# Patient Record
Sex: Male | Born: 1985 | Race: Black or African American | Hispanic: No | Marital: Single | Smoking: Former smoker
Health system: Southern US, Community
[De-identification: ages and names within clinical notes are randomized; demographics above are authoritative.]

## PROBLEM LIST (undated history)

## (undated) DIAGNOSIS — N200 Calculus of kidney: Secondary | ICD-10-CM

## (undated) DIAGNOSIS — S42009A Fracture of unspecified part of unspecified clavicle, initial encounter for closed fracture: Secondary | ICD-10-CM

## (undated) HISTORY — DX: Fracture of unspecified part of unspecified clavicle, initial encounter for closed fracture: S42.009A

## (undated) HISTORY — DX: Calculus of kidney: N20.0

---

## 2019-10-15 ENCOUNTER — Other Ambulatory Visit: Payer: Self-pay

## 2019-10-15 ENCOUNTER — Emergency Department (HOSPITAL_COMMUNITY)
Admission: EM | Admit: 2019-10-15 | Discharge: 2019-10-15 | Disposition: A | Discharge status: Home / Self Care | Payer: Self-pay | Attending: Emergency Medicine | Admitting: Emergency Medicine

## 2019-10-15 ENCOUNTER — Encounter (HOSPITAL_COMMUNITY): Payer: Self-pay

## 2019-10-15 DIAGNOSIS — K029 Dental caries, unspecified: Secondary | ICD-10-CM

## 2019-10-15 DIAGNOSIS — F17200 Nicotine dependence, unspecified, uncomplicated: Secondary | ICD-10-CM | POA: Insufficient documentation

## 2019-10-15 DIAGNOSIS — K047 Periapical abscess without sinus: Secondary | ICD-10-CM

## 2019-10-15 MED ORDER — HYDROCODONE-ACETAMINOPHEN 5-325 MG PO TABS: 1.0000 | ORAL_TABLET | Freq: Four times a day (QID) | ORAL | 0 refills | Status: DC | PRN

## 2019-10-15 MED ORDER — AMOXICILLIN 500 MG PO CAPS: 500.0000 mg | ORAL_CAPSULE | Freq: Three times a day (TID) | ORAL | 0 refills | Status: AC

## 2019-10-15 NOTE — ED Provider Notes (Signed)
[facility] EMERGENCY DEPARTMENT Provider Note   CSN: [id number] Arrival date & time: 10/15/19  1104     History Chief Complaint  Patient presents with  . Dental Pain    Dietrick Barris is a 34 y.o. male presenting with a 2 week history of escalating low right molar dental pain radiating into his right jaw.  Reports a fracture of his right    The patient has a history of injury and/or decay in the tooth involved which has recently started to cause increased  pain.  There has been no fevers, chills, nausea or vomiting, also no complaint of difficulty swallowing, although chewing makes pain worse.  The patient has tried ambesol and has been taking tylenol 2 tablets and motrin 2 tablets qam before starting work without relief of symptoms.  He endorses cold sensitivity and difficulty chewing on the right side.    The history is provided by the patient.       History reviewed. No pertinent past medical history.  There are no problems to display for this patient.   History reviewed. No pertinent surgical history.     No family history on file.  Social History   Tobacco Use  . Smoking status: Current Some Day Smoker  . Smokeless tobacco: Never Used  Substance Use Topics  . Alcohol use: Yes    Comment: occ  . Drug use: Never    Home Medications Prior to Admission medications   Medication Sig Start Date End Date Taking? Authorizing Provider  amoxicillin (AMOXIL) 500 MG capsule Take 1 capsule (500 mg total) by mouth 3 (three) times daily for 10 days. 10/15/19 10/25/19  Burgess Amor, PA-C  HYDROcodone-acetaminophen (NORCO/VICODIN) 5-325 MG tablet Take 1 tablet by mouth every 6 (six) hours as needed for moderate pain. 10/15/19   Burgess Amor, PA-C    Allergies    Patient has no known allergies.  Review of Systems   Review of Systems  Constitutional: Negative for fever.  HENT: Positive for dental problem. Negative for facial swelling and sore throat.   Respiratory: Negative for  shortness of breath.   Musculoskeletal: Negative for neck pain and neck stiffness.  All other systems reviewed and are negative.   Physical Exam Updated Vital Signs BP 115/71 (BP Location: Right Arm)   Pulse 82   Temp 97.8 F (36.6 C) (Oral)   Resp 18   Ht 5\' 6"  (1.676 m)   Wt 79.4 kg   SpO2 95%   BMI 28.25 kg/m   Physical Exam Constitutional:      General: He is not in acute distress.    Appearance: He is well-developed.  HENT:     Head: Normocephalic and atraumatic.     Jaw: No trismus, tenderness, swelling or pain on movement.     Right Ear: Tympanic membrane and external ear normal.     Left Ear: Tympanic membrane and external ear normal.     Mouth/Throat:     Mouth: No oral lesions.     Dentition: Dental tenderness and dental caries present.     Pharynx: Oropharynx is clear. Uvula midline.   Eyes:     Conjunctiva/sclera: Conjunctivae normal.  Cardiovascular:     Rate and Rhythm: Normal rate.     Heart sounds: Normal heart sounds.  Pulmonary:     Effort: Pulmonary effort is normal.  Musculoskeletal:        General: Normal range of motion.     Cervical back: Normal range of motion and  neck supple.  Lymphadenopathy:     Cervical: No cervical adenopathy.  Skin:    General: Skin is warm and dry.     Findings: No erythema.  Neurological:     Mental Status: He is alert and oriented to person, place, and time.     ED Results / Procedures / Treatments   Labs (all labs ordered are listed, but only abnormal results are displayed) Labs Reviewed - No data to display  EKG None  Radiology No results found.  Procedures Procedures (including critical care time)  Medications Ordered in ED Medications - No data to display  ED Course  I have reviewed the triage vital signs and the nursing notes.  Pertinent labs & imaging results that were available during my care of the patient were reviewed by me and considered in my medical decision making (see chart for  details).    MDM Rules/Calculators/A&P                          Pt with dental pain and cold sensitivity, suspected early apical infection.  Started on amoxil, few hydrocodone prescribed.    Prior to dc, call from pharmacy at [facility] stated they are out of norco.  Verbal order to switch from hydrocodone to tramadol 50 mg #10, q 6 hours prn pain. Advised pt of prescription change.  Pt was given local dentist list as he is looking for a local dentist since recently moving here.   narcotic database reviewed.  Final Clinical Impression(s) / ED Diagnoses Final diagnoses:  Infected dental caries    Rx / DC Orders ED Discharge Orders         Ordered    HYDROcodone-acetaminophen (NORCO/VICODIN) 5-325 MG tablet  Every 6 hours PRN     Discontinue  Reprint     10/15/19 1148    amoxicillin (AMOXIL) 500 MG capsule  3 times daily     Discontinue  Reprint     10/15/19 1148           Victoriano Lain 10/15/19 1204    Eber Hong, MD 10/16/19 0700

## 2019-10-15 NOTE — Discharge Instructions (Addendum)
Complete your entire course of antibiotics as prescribed.  You  may use the hydrocodone for pain relief but do not drive within 4 hours of taking as this will make you drowsy.  Avoid applying heat or ice to this abscess area which can worsen your symptoms.  You may use warm salt water swish and spit treatment or half peroxide and water swish and spit after meals to keep this area clean as discussed.   After the first day of antibiotics, motrin should be adequate for pain relief.  See the dentist list provided.

## 2019-10-15 NOTE — ED Notes (Signed)
Pt sitting up in bed, pt has broken tooth R lower jaw, pt states that he has had pain for the past week, has no dentist, states that he was treating with motrin and tylenol, states that it was helping, but isn't helping any more.  resps even and unlabored,

## 2019-10-15 NOTE — ED Triage Notes (Signed)
Pt reports r jaw pain x 2 weeks.

## 2019-11-13 ENCOUNTER — Encounter (HOSPITAL_COMMUNITY): Payer: Self-pay | Admitting: *Deleted

## 2019-11-13 ENCOUNTER — Emergency Department (HOSPITAL_COMMUNITY)
Admission: EM | Admit: 2019-11-13 | Discharge: 2019-11-13 | Disposition: A | Discharge status: Home / Self Care | Payer: Self-pay | Attending: Emergency Medicine | Admitting: Emergency Medicine

## 2019-11-13 ENCOUNTER — Other Ambulatory Visit: Payer: Self-pay

## 2019-11-13 DIAGNOSIS — F17200 Nicotine dependence, unspecified, uncomplicated: Secondary | ICD-10-CM | POA: Insufficient documentation

## 2019-11-13 DIAGNOSIS — R202 Paresthesia of skin: Secondary | ICD-10-CM | POA: Insufficient documentation

## 2019-11-13 DIAGNOSIS — M5442 Lumbago with sciatica, left side: Secondary | ICD-10-CM | POA: Insufficient documentation

## 2019-11-13 MED ORDER — PREDNISONE 10 MG PO TABS
ORAL_TABLET | ORAL | 0 refills | Status: DC
Start: 2019-11-13 — End: 2020-03-25

## 2019-11-13 MED ORDER — KETOROLAC TROMETHAMINE 60 MG/2ML IM SOLN
60.0000 mg | Freq: Once | INTRAMUSCULAR | Status: AC
  Administered 2019-11-13: 60 mg via INTRAMUSCULAR
  Filled 2019-11-13: qty 2

## 2019-11-13 MED ORDER — HYDROCODONE-ACETAMINOPHEN 5-325 MG PO TABS: ORAL_TABLET | ORAL | 0 refills | Status: DC

## 2019-11-13 MED ORDER — METHOCARBAMOL 500 MG PO TABS
500.0000 mg | ORAL_TABLET | Freq: Once | ORAL | Status: AC
  Administered 2019-11-13: 500 mg via ORAL
  Filled 2019-11-13: qty 1

## 2019-11-13 MED ORDER — METHOCARBAMOL 500 MG PO TABS
500.0000 mg | ORAL_TABLET | Freq: Three times a day (TID) | ORAL | 0 refills | Status: DC
Start: 2019-11-13 — End: 2020-03-25

## 2019-11-13 NOTE — ED Triage Notes (Signed)
Pt with lower back pain that radiates down left leg since Tuesday.  Pt does a lot of heavy lifting.

## 2019-11-13 NOTE — Discharge Instructions (Signed)
Continue to alternate ice and heat to your lower back.  Avoid bending over, heavy lifting or twisting movements for at least 1 week.  Call the provider listed to arrange a follow-up appointment in 1 week if your symptoms are not improving.

## 2019-11-13 NOTE — ED Provider Notes (Signed)
[facility] EMERGENCY DEPARTMENT Provider Note   CSN: [id number] Arrival date & time: 11/13/19  1830     History Chief Complaint  Patient presents with  . Back Pain    Frank Carter is a 34 y.o. male.  HPI      Frank Carter is a 34 y.o. male who presents to the Emergency Department complaining of left-sided low back pain of sudden onset x2 days.  He states that he was shoveling mud on Tuesday when he felt a sharp pain to his left lower back that radiates into his left buttock, hip and thigh.  Pain is worse with certain positions and improves at rest.  He reports history of recurrent back pain.  He has tried multiple over-the-counter remedies without relief.  Back pain is associated with intermittent tingling and numbness of his leg and into his left foot.  He denies fever, chills, dysuria, urine or bowel changes, abdominal pain and left groin pain.   History reviewed. No pertinent past medical history.  There are no problems to display for this patient.   History reviewed. No pertinent surgical history.    History reviewed. No pertinent family history.  Social History   Tobacco Use  . Smoking status: Current Some Day Smoker    Types: Cigarettes  . Smokeless tobacco: Never Used  Substance Use Topics  . Alcohol use: Yes    Comment: occ  . Drug use: Never    Home Medications Prior to Admission medications   Medication Sig Start Date End Date Taking? Authorizing Provider  HYDROcodone-acetaminophen (NORCO/VICODIN) 5-325 MG tablet Take 1 tablet by mouth every 6 (six) hours as needed for moderate pain. 10/15/19   Burgess Amor, PA-C    Allergies    Patient has no known allergies.  Review of Systems   Review of Systems  Constitutional: Negative for fever.  Respiratory: Negative for shortness of breath.   Cardiovascular: Negative for chest pain and leg swelling.  Gastrointestinal: Negative for abdominal pain, constipation, diarrhea, nausea and vomiting.    Genitourinary: Negative for decreased urine volume, difficulty urinating, dysuria, flank pain and hematuria.  Musculoskeletal: Positive for back pain. Negative for joint swelling.  Skin: Negative for color change and rash.  Neurological: Negative for weakness and numbness.    Physical Exam Updated Vital Signs BP 121/72 (BP Location: Right Arm)   Pulse 88   Temp 98 F (36.7 C) (Oral)   Resp 16   Ht 5\' 6"  (1.676 m)   Wt 79.4 kg   SpO2 97%   BMI 28.25 kg/m   Physical Exam Vitals and nursing note reviewed.  Constitutional:      General: He is not in acute distress.    Appearance: Normal appearance. He is well-developed. He is not toxic-appearing.  HENT:     Head: Normocephalic.  Cardiovascular:     Rate and Rhythm: Normal rate and regular rhythm.     Pulses: Normal pulses.     Comments: DP pulses are strong and palpable bilaterally Pulmonary:     Effort: Pulmonary effort is normal. No respiratory distress.     Breath sounds: Normal breath sounds.  Abdominal:     General: There is no distension.     Palpations: Abdomen is soft.     Tenderness: There is no abdominal tenderness.  Musculoskeletal:        General: Tenderness present.     Cervical back: Normal range of motion and neck supple.     Lumbar back: Tenderness present. No  swelling, deformity or lacerations. Normal range of motion.     Comments: ttp of the left lumbar paraspinal muscles and left SI joint space. No spinal tenderness.  Pt has 5/5 strength against resistance of bilateral lower extremities.     Skin:    General: Skin is warm.     Capillary Refill: Capillary refill takes less than 2 seconds.     Findings: No rash.  Neurological:     General: No focal deficit present.     Mental Status: He is alert.     Sensory: Sensation is intact. No sensory deficit.     Motor: Motor function is intact. No weakness or abnormal muscle tone.     Coordination: Coordination is intact. Coordination normal.     Gait: Gait  normal.     Deep Tendon Reflexes:     Reflex Scores:      Patellar reflexes are 2+ on the right side and 2+ on the left side.      Achilles reflexes are 2+ on the right side and 2+ on the left side.    ED Results / Procedures / Treatments   Labs (all labs ordered are listed, but only abnormal results are displayed) Labs Reviewed - No data to display  EKG None  Radiology No results found.  Procedures Procedures (including critical care time)  Medications Ordered in ED Medications  ketorolac (TORADOL) injection 60 mg (has no administration in time range)  methocarbamol (ROBAXIN) tablet 500 mg (has no administration in time range)    ED Course  I have reviewed the triage vital signs and the nursing notes.  Pertinent labs & imaging results that were available during my care of the patient were reviewed by me and considered in my medical decision making (see chart for details).    MDM Rules/Calculators/A&P                          Patient here with recurrent left-sided low back pain that began after shoveling mud.  Describes radicular symptoms that radiate into the level of the lower left thigh.  He is ambulatory and his gait is steady.  No focal neuro deficits on exam.  No concerning symptoms for cauda equina.  Likely sciatica although I have also discussed possibility of herniated disc.  Patient agrees to treatment plan with conservative therapy and close outpatient follow-up recommended if symptoms are not improving.   Final Clinical Impression(s) / ED Diagnoses Final diagnoses:  Acute left-sided low back pain with left-sided sciatica    Rx / DC Orders ED Discharge Orders    None       Rosey Bath 11/13/19 Brunilda Payor, MD 11/13/19 2334

## 2019-12-23 ENCOUNTER — Emergency Department (HOSPITAL_COMMUNITY)
Admission: EM | Admit: 2019-12-23 | Discharge: 2019-12-23 | Disposition: A | Discharge status: Home / Self Care | Payer: Self-pay | Attending: Emergency Medicine | Admitting: Emergency Medicine

## 2019-12-23 ENCOUNTER — Encounter (HOSPITAL_COMMUNITY): Payer: Self-pay

## 2019-12-23 ENCOUNTER — Other Ambulatory Visit: Payer: Self-pay

## 2019-12-23 DIAGNOSIS — Z5321 Procedure and treatment not carried out due to patient leaving prior to being seen by health care provider: Secondary | ICD-10-CM | POA: Insufficient documentation

## 2019-12-23 DIAGNOSIS — K0889 Other specified disorders of teeth and supporting structures: Secondary | ICD-10-CM | POA: Insufficient documentation

## 2019-12-23 NOTE — ED Triage Notes (Signed)
Pt c/o r sided toothache x 1 month.

## 2019-12-24 ENCOUNTER — Other Ambulatory Visit: Payer: Self-pay

## 2019-12-24 ENCOUNTER — Emergency Department (HOSPITAL_COMMUNITY)
Admission: EM | Admit: 2019-12-24 | Discharge: 2019-12-24 | Disposition: A | Discharge status: Home / Self Care | Payer: Self-pay | Attending: Emergency Medicine | Admitting: Emergency Medicine

## 2019-12-24 ENCOUNTER — Encounter (HOSPITAL_COMMUNITY): Payer: Self-pay | Admitting: Emergency Medicine

## 2019-12-24 DIAGNOSIS — K047 Periapical abscess without sinus: Secondary | ICD-10-CM | POA: Insufficient documentation

## 2019-12-24 DIAGNOSIS — F17210 Nicotine dependence, cigarettes, uncomplicated: Secondary | ICD-10-CM | POA: Insufficient documentation

## 2019-12-24 MED ORDER — CLINDAMYCIN HCL 300 MG PO CAPS
300.0000 mg | ORAL_CAPSULE | Freq: Four times a day (QID) | ORAL | 0 refills | Status: DC
Start: 2019-12-24 — End: 2020-03-25

## 2019-12-24 MED ORDER — CLINDAMYCIN HCL 150 MG PO CAPS
300.0000 mg | ORAL_CAPSULE | Freq: Once | ORAL | Status: AC
  Administered 2019-12-24: 300 mg via ORAL
  Filled 2019-12-24: qty 2

## 2019-12-24 MED ORDER — OXYCODONE-ACETAMINOPHEN 5-325 MG PO TABS
2.0000 | ORAL_TABLET | Freq: Once | ORAL | Status: AC
  Administered 2019-12-24: 2 via ORAL
  Filled 2019-12-24: qty 2

## 2019-12-24 MED ORDER — OXYCODONE-ACETAMINOPHEN 5-325 MG PO TABS: 1.0000 | ORAL_TABLET | Freq: Four times a day (QID) | ORAL | 0 refills | Status: DC | PRN

## 2019-12-24 NOTE — Discharge Instructions (Addendum)
Begin taking clindamycin as prescribed and Percocet as prescribed as needed for pain.  Follow-up with your dentist at 8 AM as previously arranged.

## 2019-12-24 NOTE — ED Provider Notes (Signed)
[facility] EMERGENCY DEPARTMENT Provider Note   CSN: [id number] Arrival date & time: 12/24/19  0214     History Chief Complaint  Patient presents with  . Dental Pain    Frank Carter is a 34 y.o. male.  Patient is a 34 year old male with no significant past medical history.  He presents with complaints of dental pain.  He describes pain to his right lower jaw and right side of his face.  He denies any difficulty breathing or swallowing.  He denies any fevers or chills.  He does have an appointment to see the dentist at 8 AM this morning, but woke up with worse pain and bleeding from his mouth.  The history is provided by the patient.  Dental Pain Location:  Lower Lower teeth location:  29/RL 2nd bicuspid Quality:  Throbbing Severity:  Severe Duration:  1 month Timing:  Constant Progression:  Worsening Context: abscess        History reviewed. No pertinent past medical history.  There are no problems to display for this patient.   History reviewed. No pertinent surgical history.     No family history on file.  Social History   Tobacco Use  . Smoking status: Current Some Day Smoker    Types: Cigarettes  . Smokeless tobacco: Never Used  Substance Use Topics  . Alcohol use: Yes    Comment: occ  . Drug use: Never    Home Medications Prior to Admission medications   Medication Sig Start Date End Date Taking? Authorizing Provider  HYDROcodone-acetaminophen (NORCO/VICODIN) 5-325 MG tablet Take one tab po q 4 hrs prn pain 11/13/19   Triplett, Tammy, PA-C  methocarbamol (ROBAXIN) 500 MG tablet Take 1 tablet (500 mg total) by mouth 3 (three) times daily. 11/13/19   Triplett, Tammy, PA-C  predniSONE (DELTASONE) 10 MG tablet Take 6 tablets day one, 5 tablets day two, 4 tablets day three, 3 tablets day four, 2 tablets day five, then 1 tablet day six 11/13/19   Triplett, Tammy, PA-C    Allergies    Patient has no known allergies.  Review of Systems   Review of  Systems  All other systems reviewed and are negative.   Physical Exam Updated Vital Signs There were no vitals taken for this visit.  Physical Exam Vitals and nursing note reviewed.  Constitutional:      General: He is not in acute distress.    Appearance: Normal appearance. He is not ill-appearing or diaphoretic.  HENT:     Head: Normocephalic and atraumatic.     Mouth/Throat:     Mouth: Mucous membranes are moist.     Pharynx: No oropharyngeal exudate.     Comments: In the area of the right lower bicuspids, there is a swollen area adjacent to the gum consistent with an abscess.  There is some bleeding and drainage from this.  There is no swelling of the submental space.  There is no trismus or stridor. Pulmonary:     Effort: Pulmonary effort is normal.  Skin:    General: Skin is warm and dry.  Neurological:     Mental Status: He is alert.     ED Results / Procedures / Treatments   Labs (all labs ordered are listed, but only abnormal results are displayed) Labs Reviewed - No data to display  EKG None  Radiology No results found.  Procedures Procedures (including critical care time)  Medications Ordered in ED Medications  oxyCODONE-acetaminophen (PERCOCET/ROXICET) 5-325 MG per tablet 2  tablet (has no administration in time range)  clindamycin (CLEOCIN) capsule 300 mg (has no administration in time range)    ED Course  I have reviewed the triage vital signs and the nursing notes.  Pertinent labs & imaging results that were available during my care of the patient were reviewed by me and considered in my medical decision making (see chart for details).    MDM Rules/Calculators/A&P  Patient with a dental abscess that appears to have ruptured in the night.  He does have an appointment with the dentist in a few hours.  Patient will be started on clindamycin and given pain medication.  He is to keep his appointment, but otherwise appears stable for discharge.  Final  Clinical Impression(s) / ED Diagnoses Final diagnoses:  None    Rx / DC Orders ED Discharge Orders    None       Geoffery Lyons, MD 12/24/19 [phone]

## 2019-12-24 NOTE — ED Triage Notes (Signed)
Pt C/O right sided lower dental pain x 1 month.

## 2020-03-25 ENCOUNTER — Other Ambulatory Visit: Payer: Self-pay

## 2020-03-25 ENCOUNTER — Encounter (HOSPITAL_COMMUNITY): Payer: Self-pay | Admitting: Emergency Medicine

## 2020-03-25 ENCOUNTER — Emergency Department (HOSPITAL_COMMUNITY)
Admission: EM | Admit: 2020-03-25 | Discharge: 2020-03-25 | Disposition: A | Discharge status: Home / Self Care | Payer: Self-pay | Attending: Emergency Medicine | Admitting: Emergency Medicine

## 2020-03-25 DIAGNOSIS — F17210 Nicotine dependence, cigarettes, uncomplicated: Secondary | ICD-10-CM | POA: Insufficient documentation

## 2020-03-25 DIAGNOSIS — K047 Periapical abscess without sinus: Secondary | ICD-10-CM | POA: Insufficient documentation

## 2020-03-25 MED ORDER — AMOXICILLIN 500 MG PO CAPS: 1000.0000 mg | ORAL_CAPSULE | Freq: Two times a day (BID) | ORAL | 0 refills | Status: AC

## 2020-03-25 MED ORDER — AMOXICILLIN 250 MG PO CAPS
1000.0000 mg | ORAL_CAPSULE | Freq: Once | ORAL | Status: AC
  Administered 2020-03-25: 1000 mg via ORAL
  Filled 2020-03-25: qty 4

## 2020-03-25 MED ORDER — HYDROCODONE-ACETAMINOPHEN 5-325 MG PO TABS: ORAL_TABLET | ORAL | 0 refills | Status: AC

## 2020-03-25 NOTE — Discharge Instructions (Signed)
You may take ibuprofen along with the hydrocodone-acetaminophen to get additional pain relief.  You need to see a dentist for definitive care of that abscess.  If you do not see a dentist, I will continue to come back periodically.

## 2020-03-25 NOTE — ED Provider Notes (Signed)
[facility] EMERGENCY DEPARTMENT Provider Note   CSN: [id number] Arrival date & time: 03/25/20  7902   History Chief Complaint  Patient presents with  . Dental Pain    Frank Carter is a 34 y.o. male.  The history is provided by the patient.  Dental Pain He complains of pain in the right lower jaw for the last 4 days.  Pain is severe and he rates it at 10/10.  He has been taking ibuprofen without relief.  He had an abscess of the tooth several months ago, and he states he did see a dentist who wanted to have a follow-up appointment which the patient never went to.  History reviewed. No pertinent past medical history.  There are no problems to display for this patient.   History reviewed. No pertinent surgical history.     No family history on file.  Social History   Tobacco Use  . Smoking status: Current Some Day Smoker    Types: Cigarettes  . Smokeless tobacco: Never Used  Substance Use Topics  . Alcohol use: Yes    Comment: occ  . Drug use: Never    Home Medications Prior to Admission medications   Medication Sig Start Date End Date Taking? Authorizing Provider  amoxicillin (AMOXIL) 500 MG capsule Take 2 capsules (1,000 mg total) by mouth 2 (two) times daily. 03/25/20   Dione Booze, MD  HYDROcodone-acetaminophen (NORCO/VICODIN) 5-325 MG tablet Take one tab po q 4 hrs prn pain 03/25/20   Dione Booze, MD    Allergies    Patient has no known allergies.  Review of Systems   Review of Systems  All other systems reviewed and are negative.   Physical Exam Updated Vital Signs BP 133/81   Pulse (!) 55   Temp 98.2 F (36.8 C)   Resp 18   Ht 5\' 7"  (1.702 m)   Wt 68 kg   SpO2 98%   BMI 23.48 kg/m   Physical Exam Vitals and nursing note reviewed.   34 year old male, resting comfortably and in no acute distress. Vital signs are normal. Oxygen saturation is 98%, which is normal. Head is normocephalic and atraumatic. PERRLA, EOMI. Oropharynx is clear.   Teeth are without obvious caries and there is no tenderness to percussion on any teeth.  There is swelling of the gingiva and tenderness of the gingiva around tooth #31. Neck is nontender and supple without adenopathy or JVD. Back is nontender and there is no CVA tenderness. Lungs are clear without rales, wheezes, or rhonchi. Chest is nontender. Heart has regular rate and rhythm without murmur. Abdomen is soft, flat, nontender without masses or hepatosplenomegaly and peristalsis is normoactive. Extremities have no cyanosis or edema, full range of motion is present. Skin is warm and dry without rash. Neurologic: Mental status is normal, cranial nerves are intact, there are no motor or sensory deficits.  ED Results / Procedures / Treatments    Procedures Procedures  Medications Ordered in ED Medications  amoxicillin (AMOXIL) capsule 1,000 mg (has no administration in time range)    ED Course  I have reviewed the triage vital signs and the nursing notes.  MDM Rules/Calculators/A&P Dental pain which appears to be from a dental abscess.  There are no obvious caries on the teeth.  Old records are reviewed, and he has several prior ED visits for problems of the same area, and on one occasion it appeared that he had an abscess which had spontaneously ruptured.  I am  concerned that he has not had definitive treatment for his abscess.  He is discharged with prescription for amoxicillin and a small number of hydrocodone-acetaminophen tablets and he is given dental resources.  Advised that antibiotics may make him feel better temporarily, but did not fix the problem-he will need to see a dentist.  Final Clinical Impression(s) / ED Diagnoses Final diagnoses:  Dental abscess    Rx / DC Orders ED Discharge Orders         Ordered    HYDROcodone-acetaminophen (NORCO/VICODIN) 5-325 MG tablet        03/25/20 0655    amoxicillin (AMOXIL) 500 MG capsule  2 times daily        03/25/20 0655            Dione Booze, MD 03/25/20 0700

## 2020-03-25 NOTE — ED Triage Notes (Signed)
Pt c/o right sided dental/mouth pain that started x 4 days ago.

## 2020-11-11 ENCOUNTER — Emergency Department (HOSPITAL_COMMUNITY)
Admission: EM | Admit: 2020-11-11 | Discharge: 2020-11-11 | Disposition: A | Discharge status: Home / Self Care | Payer: Self-pay | Attending: Emergency Medicine | Admitting: Emergency Medicine

## 2020-11-11 ENCOUNTER — Encounter (HOSPITAL_COMMUNITY): Payer: Self-pay | Admitting: Emergency Medicine

## 2020-11-11 ENCOUNTER — Emergency Department (HOSPITAL_COMMUNITY): Payer: Self-pay

## 2020-11-11 ENCOUNTER — Other Ambulatory Visit: Payer: Self-pay

## 2020-11-11 DIAGNOSIS — X501XXA Overexertion from prolonged static or awkward postures, initial encounter: Secondary | ICD-10-CM | POA: Insufficient documentation

## 2020-11-11 DIAGNOSIS — F17210 Nicotine dependence, cigarettes, uncomplicated: Secondary | ICD-10-CM | POA: Insufficient documentation

## 2020-11-11 DIAGNOSIS — S93401A Sprain of unspecified ligament of right ankle, initial encounter: Secondary | ICD-10-CM | POA: Insufficient documentation

## 2020-11-11 DIAGNOSIS — M25571 Pain in right ankle and joints of right foot: Secondary | ICD-10-CM | POA: Insufficient documentation

## 2020-11-11 MED ORDER — OXYCODONE-ACETAMINOPHEN 5-325 MG PO TABS
1.0000 | ORAL_TABLET | Freq: Once | ORAL | Status: AC
  Administered 2020-11-11: 1 via ORAL
  Filled 2020-11-11: qty 1

## 2020-11-11 NOTE — ED Triage Notes (Signed)
Pt jumped off the third step of a ladder and his right ankle twisted and popped per the pt. Obvious swelling noted to right ankle.

## 2020-11-11 NOTE — Discharge Instructions (Signed)
I suspect you sprained your ankle, placed you in a brace please wear during the day you may take off at nighttime, please remain nonweightbearing.  I recommend over-the-counter pain medications, keeping the foot elevated and applying ice to the area.  Please follow-up with orthopedic surgery for further evaluation.  Come back to the emergency department if you develop chest pain, shortness of breath, severe abdominal pain, uncontrolled nausea, vomiting, diarrhea.

## 2020-11-11 NOTE — ED Provider Notes (Signed)
[facility] EMERGENCY DEPARTMENT Provider Note   CSN: [id number] Arrival date & time: 11/11/20  0801     History Chief Complaint  Patient presents with   Ankle Pain    Frank Carter is a 35 y.o. male.  HPI  Patient with no significant medical history presents to the emergency department with chief complaint of right ankle pain.  Patient states today he was stepping down from a ladder unfortunately ground was unlevel and he twisted his right ankle, he denies falling, hitting his head, he is not on anticoagulant.  He states he has severe pain in his right ankle, is unable to ambulate due to pain, he has occasional paresthesias, states he is able to move his toes and partially flex/ extend at the ankle able to bend his knee without difficulty.  He denies any alleviating factors.  Not had any pain medication for this.  He does not endorse fevers, chills, chest pain, abdominal pain, pedal edema.  History reviewed. No pertinent past medical history.  There are no problems to display for this patient.   History reviewed. No pertinent surgical history.     History reviewed. No pertinent family history.  Social History   Tobacco Use   Smoking status: Some Days    Types: Cigarettes   Smokeless tobacco: Never  Substance Use Topics   Alcohol use: Yes    Comment: occ   Drug use: Yes    Types: Marijuana    Home Medications Prior to Admission medications   Medication Sig Start Date End Date Taking? Authorizing Provider  amoxicillin (AMOXIL) 500 MG capsule Take 2 capsules (1,000 mg total) by mouth 2 (two) times daily. Patient not taking: Reported on 11/11/2020 03/25/20   Dione Booze, MD  HYDROcodone-acetaminophen (NORCO/VICODIN) 5-325 MG tablet Take one tab po q 4 hrs prn pain Patient not taking: Reported on 11/11/2020 03/25/20   Dione Booze, MD    Allergies    Patient has no known allergies.  Review of Systems   Review of Systems  Constitutional:  Negative for chills and  fever.  HENT:  Negative for congestion.   Respiratory:  Negative for shortness of breath.   Cardiovascular:  Negative for chest pain.  Gastrointestinal:  Negative for abdominal pain.  Genitourinary:  Negative for enuresis.  Musculoskeletal:  Negative for back pain.       Right ankle pain.  Skin:  Negative for rash.  Neurological:  Negative for dizziness.  Hematological:  Does not bruise/bleed easily.   Physical Exam Updated Vital Signs BP (!) 125/91   Pulse 75   Temp 98.3 F (36.8 C) (Oral)   Resp 17   Ht 5\' 7"  (1.702 m)   Wt 79.4 kg   SpO2 99%   BMI 27.41 kg/m   Physical Exam Vitals and nursing note reviewed.  Constitutional:      General: He is not in acute distress.    Appearance: He is not ill-appearing.  HENT:     Head: Normocephalic and atraumatic.     Nose: No congestion.  Eyes:     Conjunctiva/sclera: Conjunctivae normal.  Cardiovascular:     Rate and Rhythm: Normal rate and regular rhythm.     Pulses: Normal pulses.     Heart sounds: No murmur heard.   No friction rub. No gallop.  Pulmonary:     Effort: Pulmonary effort is normal.  Musculoskeletal:        General: Swelling and tenderness present. No deformity or signs of injury.  Right lower leg: No edema.     Comments: Patient's right ankle was visualized she has slight edema noted on the lateral malleolus, no erythema, no gross deformities present, he is able to move his toes without difficulty, able to flex and extend at the ankle, able to flex and extend the knee, he had a negative Thompson sign, no tenderness along patient's Achilles tendon, neurovascular fully intact.  Skin:    General: Skin is warm and dry.  Neurological:     Mental Status: He is alert.  Psychiatric:        Mood and Affect: Mood normal.    ED Results / Procedures / Treatments   Labs (all labs ordered are listed, but only abnormal results are displayed) Labs Reviewed - No data to display  EKG None  Radiology DG Ankle  Complete Right  Result Date: 11/11/2020 CLINICAL DATA:  Fall, twisted ankle EXAM: RIGHT ANKLE - COMPLETE 3+ VIEW COMPARISON:  None. FINDINGS: There is no acute fracture or dislocation. Alignment is normal. The ankle mortise is symmetrically intact. The joint spaces are preserved. There is soft tissue swelling over the lateral ankle mortise. IMPRESSION: No acute fracture or dislocation. Soft tissue swelling over the lateral ankle mortise. Electronically Signed   By: Lesia Hausen MD   On: 11/11/2020 08:50    Procedures Procedures   Medications Ordered in ED Medications  oxyCODONE-acetaminophen (PERCOCET/ROXICET) 5-325 MG per tablet 1 tablet (1 tablet Oral Given 11/11/20 1020)    ED Course  I have reviewed the triage vital signs and the nursing notes.  Pertinent labs & imaging results that were available during my care of the patient were reviewed by me and considered in my medical decision making (see chart for details).    MDM Rules/Calculators/A&P                          Initial impression-patient presents with Right ankle pain.  He is alert, does not appear in distress, triage obtained imaging.  Work-up-x-ray of right ankle is negative for acute findings.  Rule out- I have low suspicion for septic arthritis as patient denies IV drug use, skin exam was performed no erythematous, edematous, warm joints noted on exam, no new heart murmur heard on exam.  Low suspicion for fracture or dislocation as x-ray does not feel any significant findings. low suspicion for ligament or tendon damage as area was palpated no gross defects noted, they had full range of motion as well as 5/5 strength.  Low suspicion for compartment syndrome as area was palpated it was soft to the touch, neurovascular fully intact.   Plan-  Right ankle pain- suspect patient has a ligament strain versus possible partial tear, will place him a ASO brace, make him nonweightbearing, give crutches, follow-up with orthopedic  surgery for further evaluation.  Vital signs have remained stable, no indication for hospital admission.   Patient given at home care as well strict return precautions.  Patient verbalized that they understood agreed to said plan.  Final Clinical Impression(s) / ED Diagnoses Final diagnoses:  Sprain of right ankle, unspecified ligament, initial encounter    Rx / DC Orders ED Discharge Orders     None        Carroll Sage, PA-C 11/11/20 1030    Bethann Berkshire, MD 11/14/20 [phone]

## 2021-05-06 ENCOUNTER — Emergency Department (HOSPITAL_COMMUNITY)
Admission: EM | Admit: 2021-05-06 | Discharge: 2021-05-07 | Disposition: A | Discharge status: Home / Self Care | Payer: Self-pay | Attending: Emergency Medicine | Admitting: Emergency Medicine

## 2021-05-06 ENCOUNTER — Other Ambulatory Visit: Payer: Self-pay

## 2021-05-06 ENCOUNTER — Encounter (HOSPITAL_COMMUNITY): Payer: Self-pay

## 2021-05-06 DIAGNOSIS — S39012A Strain of muscle, fascia and tendon of lower back, initial encounter: Secondary | ICD-10-CM | POA: Insufficient documentation

## 2021-05-06 DIAGNOSIS — X58XXXA Exposure to other specified factors, initial encounter: Secondary | ICD-10-CM | POA: Insufficient documentation

## 2021-05-06 DIAGNOSIS — Y9301 Activity, walking, marching and hiking: Secondary | ICD-10-CM | POA: Insufficient documentation

## 2021-05-06 DIAGNOSIS — F17210 Nicotine dependence, cigarettes, uncomplicated: Secondary | ICD-10-CM | POA: Insufficient documentation

## 2021-05-06 MED ORDER — METHOCARBAMOL 500 MG PO TABS
1000.0000 mg | ORAL_TABLET | Freq: Once | ORAL | Status: AC
  Administered 2021-05-06: 1000 mg via ORAL
  Filled 2021-05-06: qty 2

## 2021-05-06 MED ORDER — ACETAMINOPHEN 500 MG PO TABS
1000.0000 mg | ORAL_TABLET | Freq: Once | ORAL | Status: AC
  Administered 2021-05-06: 1000 mg via ORAL
  Filled 2021-05-06: qty 2

## 2021-05-06 MED ORDER — NAPROXEN 250 MG PO TABS
500.0000 mg | ORAL_TABLET | Freq: Once | ORAL | Status: AC
  Administered 2021-05-06: 500 mg via ORAL
  Filled 2021-05-06: qty 2

## 2021-05-06 NOTE — ED Triage Notes (Signed)
Report back pain with spasms. States he drove to [place] today and after had a hard time walking and getting out of the car.

## 2021-05-06 NOTE — ED Notes (Signed)
ED Provider at bedside. 

## 2021-05-06 NOTE — ED Provider Notes (Signed)
[facility]-EMERGENCY DEPT [facility] Emergency Department Provider Note MRN:  [id number]  Arrival date & time: 05/07/21     Chief Complaint   Back Pain   History of Present Illness   Frank Carter is a 36 y.o. year-old male with no pertinent past medical history presenting to the ED with chief complaint of back pain.  Patient woke up with some back soreness this morning.  Drove to [place] and back today.  When getting out of the car in [place], he experienced a great deal of stiffness, trouble getting out of the car due to pain.  Upon return to [facility], all symptoms were even worse.  He denies numbness or weakness to the arms or legs, no bowel or bladder dysfunction.  No fever.  Pain to the bilateral lumbar back is diffuse.  Review of Systems  A thorough review of systems was obtained and all systems are negative except as noted in the HPI and PMH.   Patient's Health History   History reviewed. No pertinent past medical history.  History reviewed. No pertinent surgical history.  History reviewed. No pertinent family history.  Social History   Socioeconomic History   Marital status: Single    Spouse name: Not on file   Number of children: Not on file   Years of education: Not on file   Highest education level: Not on file  Occupational History   Not on file  Tobacco Use   Smoking status: Some Days    Types: Cigarettes   Smokeless tobacco: Never  Substance and Sexual Activity   Alcohol use: Yes    Comment: occ   Drug use: Yes    Types: Marijuana   Sexual activity: Not on file  Other Topics Concern   Not on file  Social History Narrative   Not on file   Social Determinants of Health   Financial Resource Strain: Not on file  Food Insecurity: Not on file  Transportation Needs: Not on file  Physical Activity: Not on file  Stress: Not on file  Social Connections: Not on file  Intimate Partner Violence: Not on file     Physical Exam   Vitals:   05/06/21  2140  BP: 119/77  Pulse: 60  Resp: 15  Temp: 98 F (36.7 C)  SpO2: 100%    CONSTITUTIONAL: Well-appearing, NAD NEURO/PSYCH:  Alert and oriented x 3, normal and symmetric strength and sensation to the lower extremities EYES:  eyes equal and reactive ENT/NECK:  no LAD, no JVD CARDIO: Regular rate, well-perfused, normal S1 and S2 PULM:  CTAB no wheezing or rhonchi GI/GU:  non-distended, non-tender MSK/SPINE:  No gross deformities, no edema SKIN:  no rash, atraumatic   *Additional and/or pertinent findings included in MDM below  Diagnostic and Interventional Summary    EKG Interpretation  Date/Time:    Ventricular Rate:    PR Interval:    QRS Duration:   QT Interval:    QTC Calculation:   R Axis:     Text Interpretation:         Labs Reviewed - No data to display  No orders to display    Medications  acetaminophen (TYLENOL) tablet 1,000 mg (1,000 mg Oral Given 05/06/21 2311)  naproxen (NAPROSYN) tablet 500 mg (500 mg Oral Given 05/06/21 2311)  methocarbamol (ROBAXIN) tablet 1,000 mg (1,000 mg Oral Given 05/06/21 2311)     Procedures  /  Critical Care Procedures  ED Course and Medical Decision Making  Initial Impression and  Ddx Suspect muscle strain or spasm, no inciting event or trauma, no signs or symptoms of myelopathy.  Currently with no indication for imaging.  Providing pain control and will reassess.  Past medical/surgical history that increases complexity of ED encounter: None  Interpretation of Diagnostics With no trauma there is no indication for imaging.   Patient Reassessment and Ultimate Disposition/Management Patient feeling better after meds, continues to have a reassuring exam, appropriate for discharge.  Patient management required discussion with the following services or consulting groups:  None  Complexity of Problems Addressed Acute complicated illness or Injury  Additional Data Reviewed and Analyzed Further history obtained  from: None  Factors Impacting ED Encounter Risk Prescriptions  Elmer Sow. Pilar Plate, MD [facility] Emergency Medicine [facility] [email or web address]  Final Clinical Impressions(s) / ED Diagnoses     ICD-10-CM   1. Strain of lumbar region, initial encounter  S39.012A       ED Discharge Orders          Ordered    naproxen (NAPROSYN) 500 MG tablet  2 times daily        05/07/21 0027    methocarbamol (ROBAXIN) 500 MG tablet  Every 8 hours PRN        05/07/21 0027             Discharge Instructions Discussed with and Provided to Patient:     Discharge Instructions      You were evaluated in the Emergency Department and after careful evaluation, we did not find any emergent condition requiring admission or further testing in the hospital.  Your exam/testing today is overall reassuring.  Symptoms likely due to muscle strain or spasm.  Take the Naprosyn as directed for pain.  You can use the Robaxin as well for more significant pain keeping you from sleeping at night.  Please return to the Emergency Department if you experience any worsening of your condition.   Thank you for allowing [place] to be a part of your care.       Sabas Sous, MD 05/07/21 Jacinta Shoe

## 2021-05-07 MED ORDER — METHOCARBAMOL 500 MG PO TABS: 500.0000 mg | ORAL_TABLET | Freq: Three times a day (TID) | ORAL | 0 refills | Status: AC | PRN

## 2021-05-07 MED ORDER — NAPROXEN 500 MG PO TABS: 500.0000 mg | ORAL_TABLET | Freq: Two times a day (BID) | ORAL | 0 refills | Status: AC

## 2021-05-07 NOTE — Discharge Instructions (Signed)
You were evaluated in the Emergency Department and after careful evaluation, we did not find any emergent condition requiring admission or further testing in the hospital.  Your exam/testing today is overall reassuring.  Symptoms likely due to muscle strain or spasm.  Take the Naprosyn as directed for pain.  You can use the Robaxin as well for more significant pain keeping you from sleeping at night.  Please return to the Emergency Department if you experience any worsening of your condition.   Thank you for allowing [place] to be a part of your care.

## 2021-05-07 NOTE — ED Notes (Signed)
ED Provider at bedside. 

## 2021-12-17 IMAGING — DX DG ANKLE COMPLETE 3+V*R*
3 series · 3 of 3 positions shown · non-contrast
Comparison: None.

CLINICAL DATA: Fall, twisted ankle

EXAM:
RIGHT ANKLE - COMPLETE 3+ VIEW

[ankle ap]
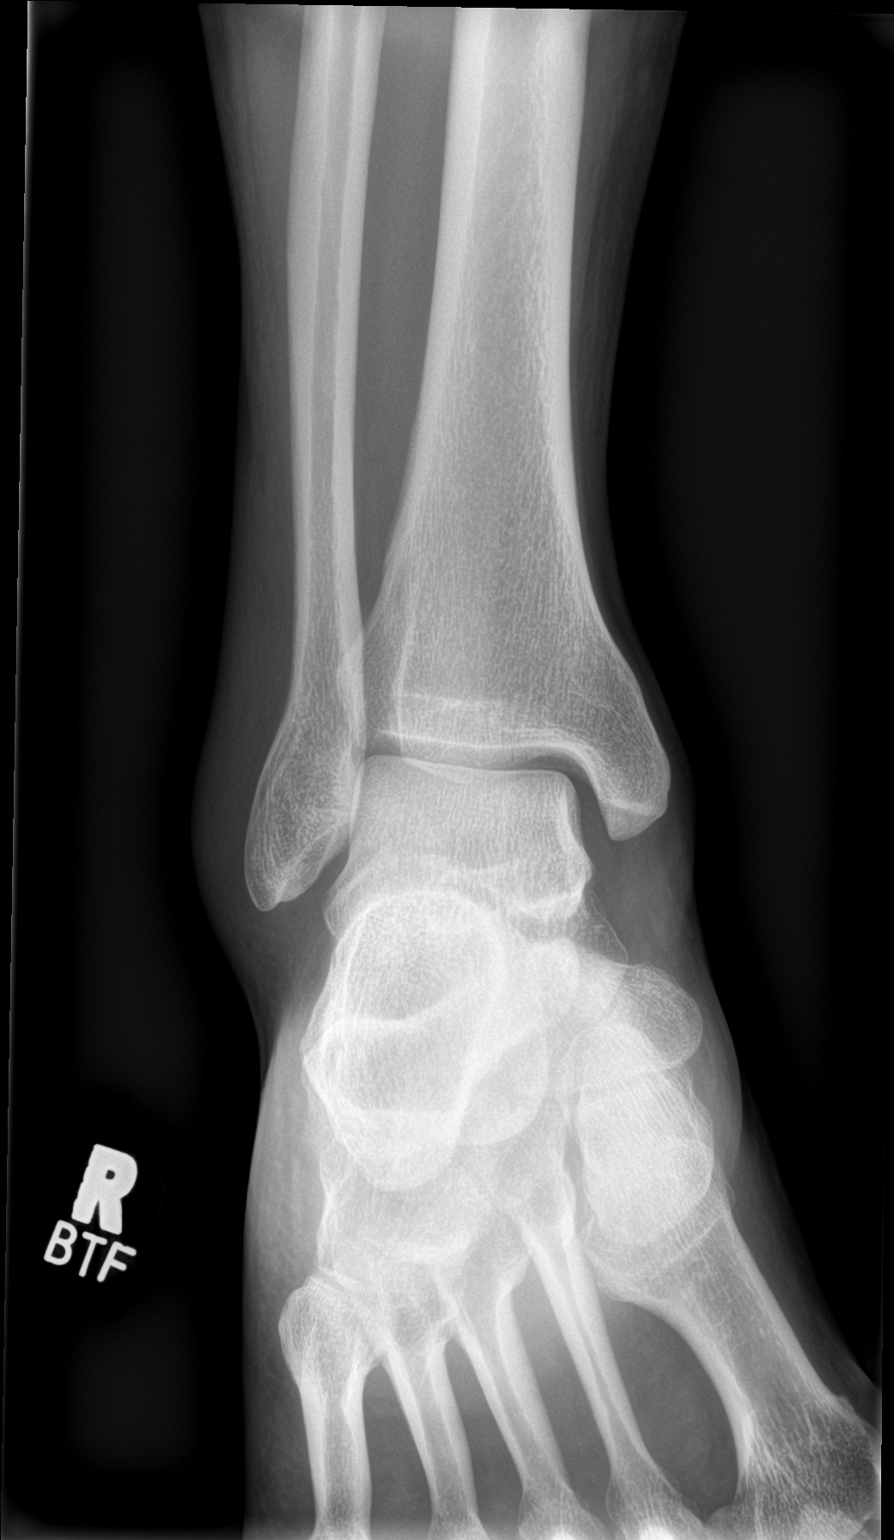

[ankle obl]
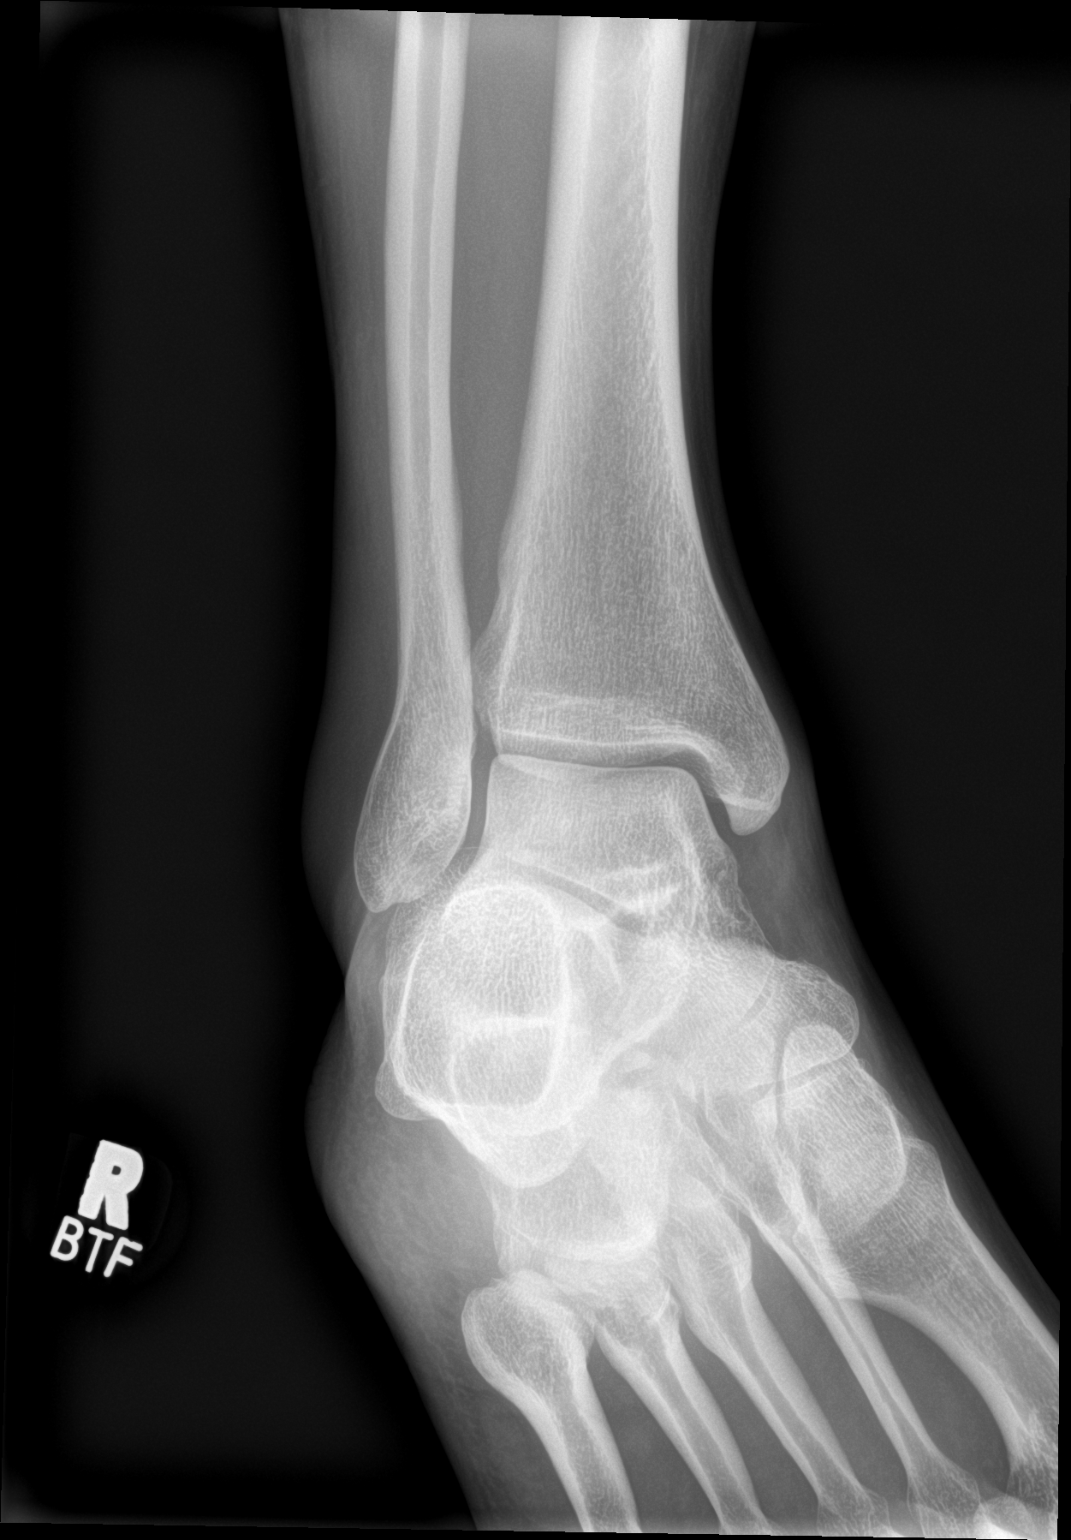

[ankle lat]
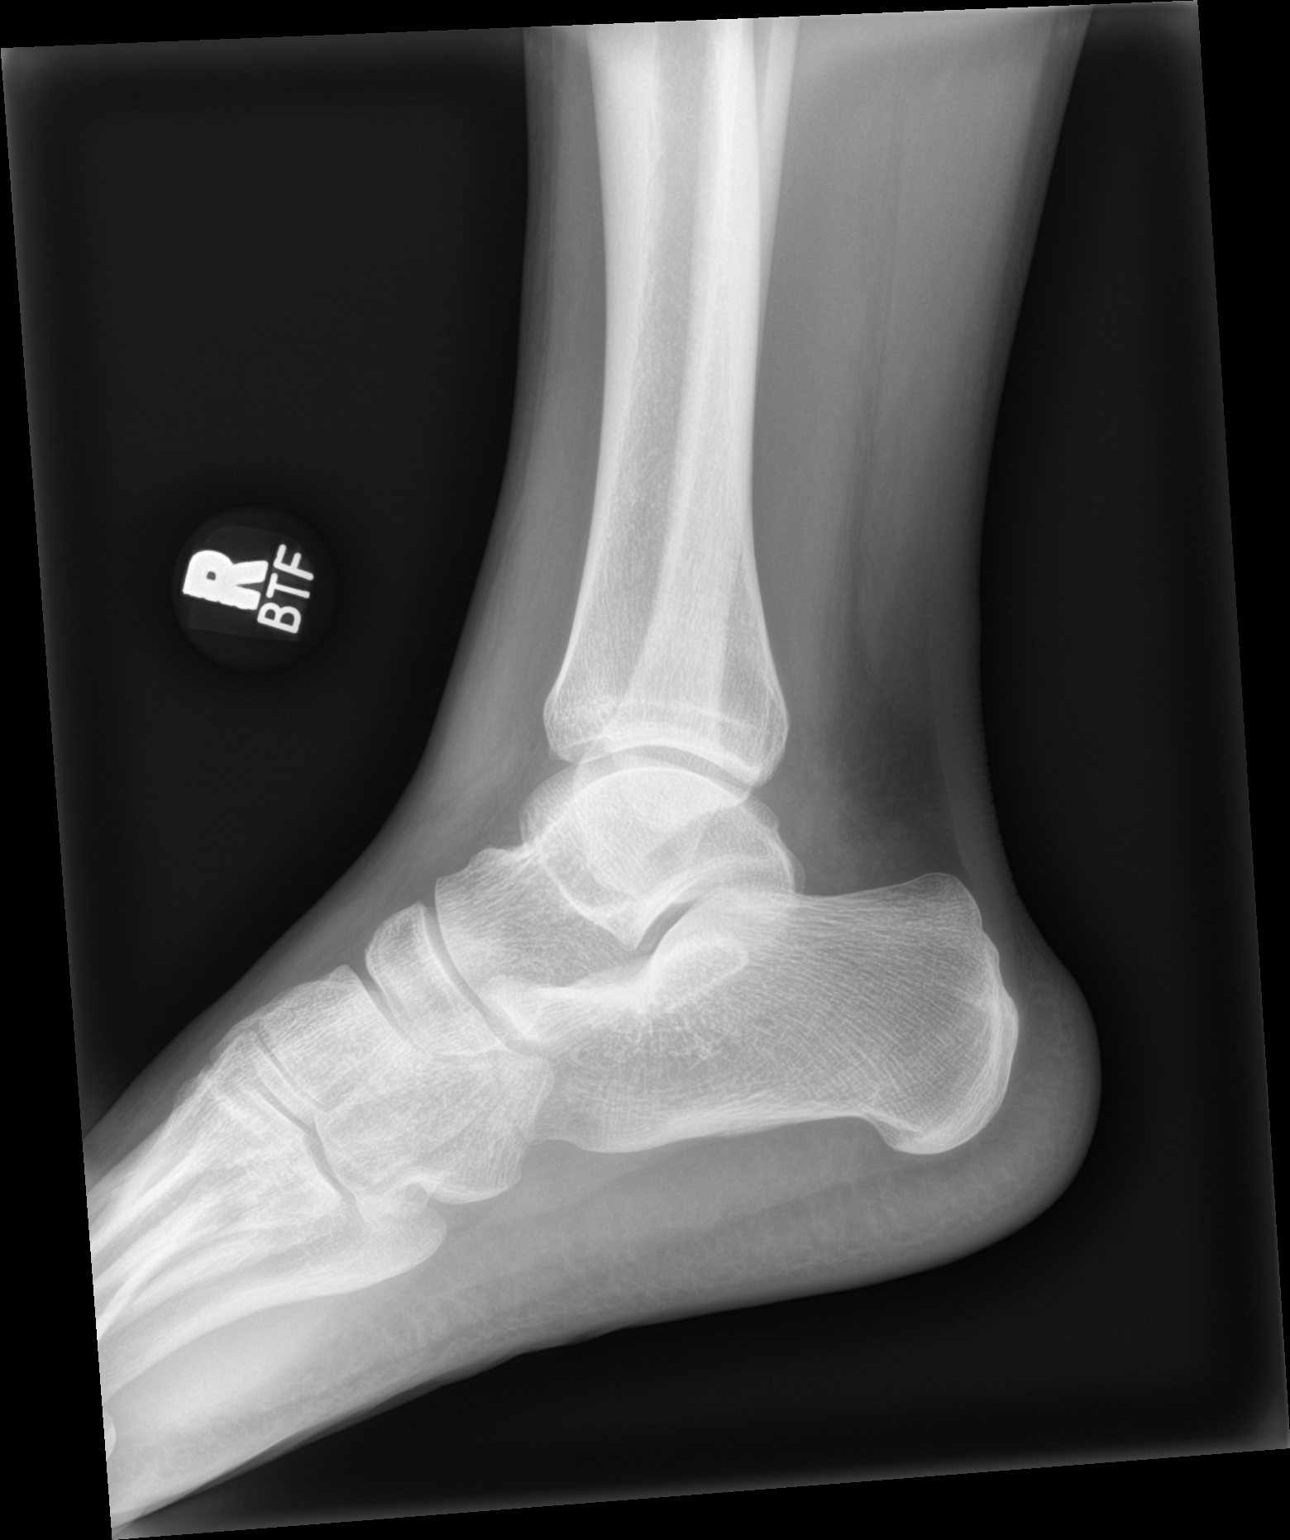

[3 of 3 positions shown; findings below may reference images not displayed]

FINDINGS: There is no acute fracture or dislocation. Alignment is normal. The
ankle mortise is symmetrically intact. The joint spaces are
preserved.

There is soft tissue swelling over the lateral ankle mortise.
IMPRESSION: No acute fracture or dislocation. Soft tissue swelling over the
lateral ankle mortise.

## 2021-12-19 ENCOUNTER — Other Ambulatory Visit: Payer: Self-pay

## 2021-12-19 ENCOUNTER — Encounter (HOSPITAL_COMMUNITY): Payer: Self-pay | Admitting: *Deleted

## 2021-12-19 ENCOUNTER — Emergency Department (HOSPITAL_COMMUNITY)
Admission: EM | Admit: 2021-12-19 | Discharge: 2021-12-19 | Disposition: A | Discharge status: Home / Self Care | Payer: Self-pay | Attending: Emergency Medicine | Admitting: Emergency Medicine

## 2021-12-19 DIAGNOSIS — X500XXA Overexertion from strenuous movement or load, initial encounter: Secondary | ICD-10-CM | POA: Insufficient documentation

## 2021-12-19 DIAGNOSIS — M79605 Pain in left leg: Secondary | ICD-10-CM

## 2021-12-19 DIAGNOSIS — S76312A Strain of muscle, fascia and tendon of the posterior muscle group at thigh level, left thigh, initial encounter: Secondary | ICD-10-CM | POA: Insufficient documentation

## 2021-12-19 MED ORDER — NAPROXEN 500 MG PO TABS: 500.0000 mg | ORAL_TABLET | Freq: Two times a day (BID) | ORAL | 0 refills | Status: AC

## 2021-12-19 MED ORDER — METHOCARBAMOL 500 MG PO TABS: 500.0000 mg | ORAL_TABLET | Freq: Two times a day (BID) | ORAL | 0 refills | Status: AC

## 2021-12-19 MED ORDER — METHOCARBAMOL 500 MG PO TABS
500.0000 mg | ORAL_TABLET | Freq: Once | ORAL | Status: AC
  Administered 2021-12-19: 500 mg via ORAL
  Filled 2021-12-19: qty 1

## 2021-12-19 MED ORDER — KETOROLAC TROMETHAMINE 60 MG/2ML IM SOLN
60.0000 mg | Freq: Once | INTRAMUSCULAR | Status: AC
  Administered 2021-12-19: 60 mg via INTRAMUSCULAR
  Filled 2021-12-19: qty 2

## 2021-12-19 NOTE — ED Triage Notes (Signed)
Pt c/o continued left posterior leg pain x 1 month. Unknown injury. Pt has used ice packs with minimal relief.

## 2021-12-19 NOTE — Discharge Instructions (Addendum)
You were seen for pain that we believe is due to a muscle strain.  Continue taking the Tylenol 1 g every 6-8 hours, naproxen as prescribed and muscle relaxers Robaxin as needed.  You can also continue alternating ice and heat on the muscle and doing gentle stretches.  Follow-up with orthopedics by calling the phone number in your discharge paperwork to make an appointment.  Come back for any severe worsening pain, new weakness, loss of sensation, numbness or tingling or loss of bladder or bowels.

## 2021-12-19 NOTE — ED Notes (Signed)
Pt seen, treated and d/c'd prior to RN assessment, see MD notes, orders received, initiated and pending. Delay in d/c d/t orders.

## 2021-12-19 NOTE — ED Provider Notes (Signed)
[facility] EMERGENCY DEPARTMENT Provider Note   CSN: [id number] Arrival date & time: 12/19/21  2458     History  Chief Complaint  Patient presents with   Leg Pain    Frank Carter is a 36 y.o. male.  With no significant past medical history who presents with left posterior thigh pain x1 month.  Patient works in [employer] and says he often lifts and moves heavy things.  He does not play any sports or have any recent injuries.  He has had worsening pain and tightness in his left posterior thigh region in his hamstrings.  He says he feels like it radiates from his left butt to his left posterior knee.  It is worse after resting and laying down for a while and then trying to stand or sit up.  It gets better with increased movement.  He does not have any pain in his back rating down to his leg.  He has had no recent injuries.  He has been using Tylenol and ibuprofen and taking sleeping pills to help sleep.  He has no numbness, no tingling, no loss sensation or weakness.  No loss of bladder or bowels.  He supposed to see an orthopedics for back problems but has not seen them yet.   Leg Pain      Home Medications Prior to Admission medications   Medication Sig Start Date End Date Taking? Authorizing Provider  methocarbamol (ROBAXIN) 500 MG tablet Take 1 tablet (500 mg total) by mouth 2 (two) times daily. 12/19/21  Yes Elgie Congo, MD  naproxen (NAPROSYN) 500 MG tablet Take 1 tablet (500 mg total) by mouth 2 (two) times daily. 12/19/21  Yes Elgie Congo, MD  amoxicillin (AMOXIL) 500 MG capsule Take 2 capsules (1,000 mg total) by mouth 2 (two) times daily. Patient not taking: Reported on 0/99/8338 25/05/39   Delora Fuel, MD  HYDROcodone-acetaminophen (NORCO/VICODIN) 5-325 MG tablet Take one tab po q 4 hrs prn pain Patient not taking: Reported on 7/67/3419 37/90/24   Delora Fuel, MD  methocarbamol (ROBAXIN) 500 MG tablet Take 1 tablet (500 mg total) by mouth every 8 (eight) hours  as needed for muscle spasms. Patient not taking: Reported on 12/19/2021 05/07/21   Maudie Flakes, MD  naproxen (NAPROSYN) 500 MG tablet Take 1 tablet (500 mg total) by mouth 2 (two) times daily. Patient not taking: Reported on 12/19/2021 05/07/21   Maudie Flakes, MD      Allergies    Patient has no known allergies.    Review of Systems   Review of Systems  Physical Exam Updated Vital Signs BP 125/78 (BP Location: Right Arm)   Pulse 63   Temp 97.7 F (36.5 C) (Oral)   Resp 15   Ht 5\' 8"  (1.727 m)   Wt 72.6 kg   SpO2 100%   BMI 24.33 kg/m  Physical Exam Constitutional: Alert and oriented. Well appearing and in no distress. Eyes: Conjunctivae are normal. ENT      Head: Normocephalic and atraumatic.      Nose: No congestion.      Mouth/Throat: Mucous membranes are moist.      Neck: No stridor. Cardiovascular: S1, S2,  Normal and symmetric distal pulses are present in all extremities.Warm and well perfused. Respiratory: Normal respiratory effort.   Musculoskeletal: Normal range of motion in all extremities.  No midline back pain step-offs or deformities.      Right lower leg: No tenderness or edema.  Left lower leg: Mild tenderness to the left hamstrings but no erythema, no ecchymoses, no evidence of external injury.  No effusions.  Compartments are soft.  No tenderness distally.  No bony tenderness or abnormalities.  Distal pulses intact.  Warm and dry. Neurologic: Normal speech and language.  5 out of 5 strength bilateral hip flexors, knee flexors and extensors, foot dorsiflexion and plantarflexion.  Steady ambulatory gait.  No gross focal neurologic deficits are appreciated. Skin: Skin is warm, dry and intact. No rash noted. Psychiatric: Mood and affect are normal. Speech and behavior are normal.  ED Results / Procedures / Treatments   Labs (all labs ordered are listed, but only abnormal results are displayed) Labs Reviewed - No data to  display  EKG None  Radiology No results found.  Procedures Procedures    Medications Ordered in ED Medications  ketorolac (TORADOL) injection 60 mg (has no administration in time range)  methocarbamol (ROBAXIN) tablet 500 mg (has no administration in time range)    ED Course/ Medical Decision Making/ A&P                           Medical Decision Making  Frank Carter is a 36 y.o. male.  With no significant past medical history who presents with left posterior thigh pain x1 month.  Patient's pain is most consistent with nonspecific hamstring strain.  He has no skin changes or findings concerning for cellulitis.  His compartments are soft, no concern for compartment syndrome.  He has had no recent injuries and no bony tenderness or deformity is ambulatory, no concern for fracture or dislocation.  He has no neurologic deficits and no midline back pain, no concern for spinal etiology.  He has distal palpable pulses and sensation intact and neurovascularly intact, no concern for ischemic limb.  We will give patient Toradol and Robaxin in the ER.  Will discharge with Naprosyn and as needed Robaxin and orthopedic information for outpatient follow-up.  Advised continued RICE method and gentle exercises.  Return precautions discussed.    Final Clinical Impression(s) / ED Diagnoses Final diagnoses:  Left leg pain  Strain of left hamstring muscle, initial encounter    Rx / DC Orders ED Discharge Orders          Ordered    naproxen (NAPROSYN) 500 MG tablet  2 times daily        12/19/21 0836    methocarbamol (ROBAXIN) 500 MG tablet  2 times daily        12/19/21 0836              Elgie Congo, MD 12/19/21 [id number]

## 2022-03-01 ENCOUNTER — Other Ambulatory Visit: Payer: Self-pay

## 2022-03-01 ENCOUNTER — Encounter (HOSPITAL_COMMUNITY): Payer: Self-pay

## 2022-03-01 ENCOUNTER — Emergency Department (HOSPITAL_COMMUNITY)
Admission: EM | Admit: 2022-03-01 | Discharge: 2022-03-01 | Disposition: A | Discharge status: Home / Self Care | Payer: Self-pay | Attending: Emergency Medicine | Admitting: Emergency Medicine

## 2022-03-01 ENCOUNTER — Emergency Department (HOSPITAL_COMMUNITY): Payer: Self-pay

## 2022-03-01 DIAGNOSIS — K529 Noninfective gastroenteritis and colitis, unspecified: Secondary | ICD-10-CM | POA: Insufficient documentation

## 2022-03-01 DIAGNOSIS — R739 Hyperglycemia, unspecified: Secondary | ICD-10-CM | POA: Insufficient documentation

## 2022-03-01 DIAGNOSIS — D72829 Elevated white blood cell count, unspecified: Secondary | ICD-10-CM | POA: Insufficient documentation

## 2022-03-01 LAB — CBC WITH DIFFERENTIAL/PLATELET
Abs Immature Granulocytes: 0.02 10*3/uL (ref 0.00–0.07)
Basophils Absolute: 0 10*3/uL (ref 0.0–0.1)
Basophils Relative: 0 %
Eosinophils Absolute: 0.1 10*3/uL (ref 0.0–0.5)
Eosinophils Relative: 1 %
HCT: 45 % (ref 39.0–52.0)
Hemoglobin: 15.5 g/dL (ref 13.0–17.0)
Immature Granulocytes: 0 %
Lymphocytes Relative: 13 %
Lymphs Abs: 1.4 10*3/uL (ref 0.7–4.0)
MCH: 28.9 pg (ref 26.0–34.0)
MCHC: 34.4 g/dL (ref 30.0–36.0)
MCV: 84 fL (ref 80.0–100.0)
Monocytes Absolute: 0.7 10*3/uL (ref 0.1–1.0)
Monocytes Relative: 6 %
Neutro Abs: 8.5 10*3/uL — ABNORMAL HIGH (ref 1.7–7.7)
Neutrophils Relative %: 80 %
Platelets: 238 10*3/uL (ref 150–400)
RBC: 5.36 MIL/uL (ref 4.22–5.81)
RDW: 13.7 % (ref 11.5–15.5)
WBC: 10.7 10*3/uL — ABNORMAL HIGH (ref 4.0–10.5)
nRBC: 0 % (ref 0.0–0.2)

## 2022-03-01 LAB — COMPREHENSIVE METABOLIC PANEL
ALT: 27 U/L (ref 0–44)
AST: 27 U/L (ref 15–41)
Albumin: 4.5 g/dL (ref 3.5–5.0)
Alkaline Phosphatase: 89 U/L (ref 38–126)
Anion gap: 11 (ref 5–15)
BUN: 12 mg/dL (ref 6–20)
CO2: 21 mmol/L — ABNORMAL LOW (ref 22–32)
Calcium: 9.5 mg/dL (ref 8.9–10.3)
Chloride: 106 mmol/L (ref 98–111)
Creatinine, Ser: 0.93 mg/dL (ref 0.61–1.24)
GFR, Estimated: >60 mL/min (ref >60)
Glucose, Bld: 111 mg/dL — ABNORMAL HIGH (ref 70–99)
Potassium: 4 mmol/L (ref 3.5–5.1)
Sodium: 138 mmol/L (ref 135–145)
Total Bilirubin: 0.5 mg/dL (ref 0.3–1.2)
Total Protein: 8.2 g/dL — ABNORMAL HIGH (ref 6.5–8.1)

## 2022-03-01 LAB — URINALYSIS, ROUTINE W REFLEX MICROSCOPIC
Bacteria, UA: NONE SEEN
Bilirubin Urine: NEGATIVE
Glucose, UA: NEGATIVE mg/dL
Hgb urine dipstick: NEGATIVE
Ketones, ur: NEGATIVE mg/dL
Leukocytes,Ua: NEGATIVE
Nitrite: NEGATIVE
Protein, ur: 30 mg/dL — AB
Specific Gravity, Urine: 1.025 (ref 1.005–1.030)
pH: 5 (ref 5.0–8.0)

## 2022-03-01 LAB — LIPASE, BLOOD: Lipase: 31 U/L (ref 11–51)

## 2022-03-01 MED ORDER — HYDROMORPHONE HCL 1 MG/ML IJ SOLN
1.0000 mg | Freq: Once | INTRAMUSCULAR | Status: AC
  Administered 2022-03-01: 1 mg via INTRAVENOUS
  Filled 2022-03-01: qty 1

## 2022-03-01 MED ORDER — LOPERAMIDE HCL 2 MG PO CAPS
2.0000 mg | ORAL_CAPSULE | Freq: Once | ORAL | Status: AC
  Administered 2022-03-01: 2 mg via ORAL
  Filled 2022-03-01: qty 1

## 2022-03-01 MED ORDER — ONDANSETRON HCL 4 MG/2ML IJ SOLN
4.0000 mg | Freq: Once | INTRAMUSCULAR | Status: AC
  Administered 2022-03-01: 4 mg via INTRAVENOUS
  Filled 2022-03-01: qty 2

## 2022-03-01 MED ORDER — ONDANSETRON HCL 4 MG PO TABS: 4.0000 mg | ORAL_TABLET | Freq: Four times a day (QID) | ORAL | 0 refills | Status: AC

## 2022-03-01 NOTE — ED Triage Notes (Signed)
Pt c/o lower abd pain and left flank pain since 12noon yesterday.  Reports history of kidney stones.  Reports n/v/d as well.

## 2022-03-01 NOTE — ED Provider Notes (Addendum)
[facility] EMERGENCY DEPARTMENT Provider Note   CSN: [id number] Arrival date & time: 03/01/22  9833     History  Chief Complaint  Patient presents with   Flank Pain   HPI Frank Carter is a 36 y.o. male with history of kidney stones presenting for flank pain.  Pain started at noon yesterday.  Pain is located in the right flank and extends down towards the right groin.  Pain is intermittent and feels like burning sensation.  Patient mentioned history of kidney stones.  States this pain is similar to when he had kidney stones in the past. Endorse is diarrhea and nausea but no vomiting.  No meds for symptoms.  Denies urinary changes including hematuria painful urination.  No bowel changes.  No fever. No recent travel and no bloody stools.   Flank Pain       Home Medications Prior to Admission medications   Medication Sig Start Date End Date Taking? Authorizing Provider  ondansetron (ZOFRAN) 4 MG tablet Take 1 tablet (4 mg total) by mouth every 6 (six) hours. 03/01/22  Yes Gareth Eagle, PA-C  amoxicillin (AMOXIL) 500 MG capsule Take 2 capsules (1,000 mg total) by mouth 2 (two) times daily. Patient not taking: Reported on 11/11/2020 03/25/20   Dione Booze, MD  HYDROcodone-acetaminophen (NORCO/VICODIN) 5-325 MG tablet Take one tab po q 4 hrs prn pain Patient not taking: Reported on 11/11/2020 03/25/20   Dione Booze, MD  methocarbamol (ROBAXIN) 500 MG tablet Take 1 tablet (500 mg total) by mouth every 8 (eight) hours as needed for muscle spasms. Patient not taking: Reported on 12/19/2021 05/07/21   Sabas Sous, MD  methocarbamol (ROBAXIN) 500 MG tablet Take 1 tablet (500 mg total) by mouth 2 (two) times daily. Patient not taking: Reported on 03/01/2022 12/19/21   Mardene Sayer, MD  naproxen (NAPROSYN) 500 MG tablet Take 1 tablet (500 mg total) by mouth 2 (two) times daily. Patient not taking: Reported on 12/19/2021 05/07/21   Sabas Sous, MD  naproxen (NAPROSYN) 500 MG  tablet Take 1 tablet (500 mg total) by mouth 2 (two) times daily. Patient not taking: Reported on 03/01/2022 12/19/21   Mardene Sayer, MD      Allergies    Patient has no known allergies.    Review of Systems   Review of Systems  Genitourinary:  Positive for flank pain.    Physical Exam Updated Vital Signs BP 128/89   Pulse 75   Temp 98 F (36.7 C)   Resp 20   Ht 5\' 7"  (1.702 m)   Wt 74.8 kg   SpO2 100%   BMI 25.84 kg/m  Physical Exam Vitals and nursing note reviewed.  HENT:     Head: Normocephalic and atraumatic.     Mouth/Throat:     Mouth: Mucous membranes are moist.  Eyes:     General:        Right eye: No discharge.        Left eye: No discharge.     Conjunctiva/sclera: Conjunctivae normal.  Cardiovascular:     Rate and Rhythm: Normal rate and regular rhythm.     Pulses: Normal pulses.     Heart sounds: Normal heart sounds.  Pulmonary:     Effort: Pulmonary effort is normal.     Breath sounds: Normal breath sounds.  Abdominal:     General: Abdomen is flat.     Palpations: Abdomen is soft.     Tenderness: There is no  abdominal tenderness.  Skin:    General: Skin is warm and dry.  Neurological:     General: No focal deficit present.  Psychiatric:        Mood and Affect: Mood normal.     ED Results / Procedures / Treatments   Labs (all labs ordered are listed, but only abnormal results are displayed) Labs Reviewed  CBC WITH DIFFERENTIAL/PLATELET - Abnormal; Notable for the following components:      Result Value   WBC 10.7 (*)    Neutro Abs 8.5 (*)    All other components within normal limits  COMPREHENSIVE METABOLIC PANEL - Abnormal; Notable for the following components:   CO2 21 (*)    Glucose, Bld 111 (*)    Total Protein 8.2 (*)    All other components within normal limits  URINALYSIS, ROUTINE W REFLEX MICROSCOPIC - Abnormal; Notable for the following components:   Protein, ur 30 (*)    All other components within normal limits   LIPASE, BLOOD    EKG None  Radiology CT RENAL STONE STUDY  Result Date: 03/01/2022 CLINICAL DATA:  Left lower quadrant pain, nausea vomiting and diarrhea. History of kidney stones. EXAM: CT ABDOMEN AND PELVIS WITHOUT CONTRAST TECHNIQUE: Multidetector CT imaging of the abdomen and pelvis was performed following the standard protocol without IV contrast. RADIATION DOSE REDUCTION: This exam was performed according to the departmental dose-optimization program which includes automated exposure control, adjustment of the mA and/or kV according to patient size and/or use of iterative reconstruction technique. COMPARISON:  None Available. FINDINGS: Lower chest: Lung bases are clear. Heart is at the upper limits of normal in size. No pericardial or pleural effusion. Distal esophagus is grossly unremarkable. Hepatobiliary: Liver is slightly enlarged, 18.1 cm. Liver and gallbladder are otherwise unremarkable. No biliary ductal dilatation. Pancreas: Negative. Spleen: Negative. Adrenals/Urinary Tract: Adrenal glands and kidneys are unremarkable. No urinary stones. Ureters are decompressed. Bladder is very low in volume. Stomach/Bowel: Stomach, small bowel and appendix are unremarkable. Wall thickening and pericolonic inflammatory haziness/stranding involving the ascending and transverse colon. Remainder of the colon is otherwise unremarkable. Vascular/Lymphatic: Vascular structures are unremarkable. No pathologically enlarged lymph nodes. Ileocolic mesenteric lymph nodes measure up to 8 mm. Reproductive: Prostate is visualized. Other: No free fluid. Mesenteries and peritoneum are otherwise unremarkable. Musculoskeletal: No worrisome lytic or sclerotic lesions. IMPRESSION: 1. Ascending and transverse colitis likely infectious/inflammatory in etiology. 2. Mild hepatomegaly. Electronically Signed   By: Leanna Battles M.D.   On: 03/01/2022 10:24    Procedures Procedures    Medications Ordered in ED Medications   HYDROmorphone (DILAUDID) injection 1 mg (1 mg Intravenous Given 03/01/22 1021)  ondansetron (ZOFRAN) injection 4 mg (4 mg Intravenous Given 03/01/22 1020)  loperamide (IMODIUM) capsule 2 mg (2 mg Oral Given 03/01/22 1109)    ED Course/ Medical Decision Making/ A&P                           Medical Decision Making Amount and/or Complexity of Data Reviewed Labs: ordered. Radiology: ordered.  Risk Prescription drug management.   Initial Impression and Ddx 36 year old male nontoxic and well-appearing presenting for flank pain.  Differential diagnosis for this complaint includes nephrolithiasis, cholelithiasis, pancreatitis and diverticulitis.  Patient PMH that increases complexity of ED encounter:    Interpretation of Diagnostics I independent reviewed and interpreted the labs as followed: Leukocytosis and hyperglycemia  - I independently visualized the following imaging with scope of interpretation limited to  determining acute life threatening conditions related to emergency care: CT abdomen and pelvis, which revealed colitis and mild hepatomegaly  Patient Reassessment and Ultimate Disposition/Management Treated pain and nausea with Dilaudid and Zofran respectively.  CT scan revealed concern for colitis.  At this point considered ischemic colitis but unlikely given no bloody stools.  Given the acute nature of the colitis is likely an inflammatory process could be foodborne or viral.  Treated diarrhea with Imodium.  Discussed return precautions and advised patient to follow-up with his PCP.  Also mention to patient that CT scan did reveal mild hepatomegaly.  At this time no further work-up warranted, patient is nontender in the right upper quadrant and liver enzymes and function panel normal.  To follow-up with his PCP for these findings.  Patient management required discussion with the following services or consulting groups:  None  Complexity of Problems Addressed Acute uncomplicated  illness or injury with no diagnostics  Additional Data Reviewed and Analyzed Further history obtained from: Prior ED visit notes  Patient Encounter Risk Assessment None         Final Clinical Impression(s) / ED Diagnoses Final diagnoses:  Colitis    Rx / DC Orders ED Discharge Orders          Ordered    ondansetron (ZOFRAN) 4 MG tablet  Every 6 hours        03/01/22 1117              Gareth Eagle, PA-C 03/01/22 1112    Gareth Eagle, PA-C 03/01/22 1118    Lonell Grandchild, MD 03/01/22 1342

## 2022-03-01 NOTE — Discharge Instructions (Signed)
Evaluation of your abdominal pain revealed that you do have colitis which is an infection of your large colon.  At this time recommend conservative treatment at home.  Continue good hydration, recommend bland diet and advance your diet as tolerated.  You can take Imodium intermittently for diarrhea.  Recommend that you follow-up with your PCP if your symptoms persist.  Your CT scan also revealed that you have a mildly enlarged liver but fortunately your liver enzymes were within normal limits thus no further work-up is warranted at this time.  Advised that you inform your PCP of these findings also.  If you have worsening abdominal pain, fever, bloody stools or blood in your urine please return to the emergency department for further evaluation.
